# Patient Record
Sex: Male | Born: 1985 | Race: Black or African American | Hispanic: No | Smoking: Current every day smoker
Health system: Southern US, Community
[De-identification: ages and names within clinical notes are randomized; demographics above are authoritative.]

## PROBLEM LIST (undated history)

## (undated) DIAGNOSIS — I10 Essential (primary) hypertension: Secondary | ICD-10-CM

---
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
## 2005-12-12 ENCOUNTER — Emergency Department (HOSPITAL_COMMUNITY): Admission: EM | Admit: 2005-12-12 | Discharge: 2005-12-12 | Payer: Self-pay | Admitting: Emergency Medicine

## 2009-09-28 ENCOUNTER — Emergency Department (HOSPITAL_COMMUNITY): Admission: EM | Admit: 2009-09-28 | Discharge: 2009-09-28 | Payer: Self-pay | Admitting: Emergency Medicine

## 2009-10-30 ENCOUNTER — Emergency Department (HOSPITAL_COMMUNITY): Admission: EM | Admit: 2009-10-30 | Discharge: 2009-10-30 | Payer: Self-pay | Admitting: Emergency Medicine

## 2010-10-30 ENCOUNTER — Emergency Department (HOSPITAL_COMMUNITY)
Admission: EM | Admit: 2010-10-30 | Discharge: 2010-10-30 | Disposition: A | Discharge status: Home / Self Care | Payer: Self-pay | Attending: Emergency Medicine | Admitting: Emergency Medicine

## 2010-10-30 DIAGNOSIS — H938X9 Other specified disorders of ear, unspecified ear: Secondary | ICD-10-CM | POA: Insufficient documentation

## 2010-10-30 DIAGNOSIS — H60399 Other infective otitis externa, unspecified ear: Secondary | ICD-10-CM | POA: Insufficient documentation

## 2010-10-30 DIAGNOSIS — H9210 Otorrhea, unspecified ear: Secondary | ICD-10-CM | POA: Insufficient documentation

## 2010-11-01 LAB — POCT I-STAT, CHEM 8
BUN: 10 mg/dL (ref 6–23)
Calcium, Ion: 1.08 mmol/L — ABNORMAL LOW (ref 1.12–1.32)
Chloride: 109 mEq/L (ref 96–112)
HCT: 49 % (ref 39.0–52.0)
Potassium: 4 mEq/L (ref 3.5–5.1)

## 2010-11-01 LAB — BASIC METABOLIC PANEL
CO2: 21 mEq/L (ref 19–32)
GFR calc Af Amer: >60 mL/min (ref >60)
GFR calc non Af Amer: >60 mL/min (ref >60)
Glucose, Bld: 123 mg/dL — ABNORMAL HIGH (ref 70–99)
Potassium: 4.2 mEq/L (ref 3.5–5.1)
Sodium: 145 mEq/L (ref 135–145)

## 2010-11-05 LAB — GASTRIC OCCULT BLOOD (1-CARD TO LAB): Occult Blood, Gastric: POSITIVE — AB

## 2010-11-05 LAB — CBC
HCT: 45.6 % (ref 39.0–52.0)
Hemoglobin: 15.4 g/dL (ref 13.0–17.0)
MCHC: 33.7 g/dL (ref 30.0–36.0)
MCV: 95.3 fL (ref 78.0–100.0)
RBC: 4.78 MIL/uL (ref 4.22–5.81)
WBC: 8.5 10*3/uL (ref 4.0–10.5)

## 2010-11-05 LAB — DIFFERENTIAL
Basophils Relative: 0 % (ref 0–1)
Eosinophils Absolute: 0.1 10*3/uL (ref 0.0–0.7)
Eosinophils Relative: 1 % (ref 0–5)
Lymphs Abs: 2.1 10*3/uL (ref 0.7–4.0)
Monocytes Absolute: 0.4 10*3/uL (ref 0.1–1.0)
Monocytes Relative: 5 % (ref 3–12)
Neutrophils Relative %: 69 % (ref 43–77)

## 2010-11-05 LAB — BASIC METABOLIC PANEL
CO2: 27 mEq/L (ref 19–32)
Chloride: 102 mEq/L (ref 96–112)
Creatinine, Ser: 0.99 mg/dL (ref 0.4–1.5)
GFR calc Af Amer: >60 mL/min (ref >60)
Potassium: 3.8 mEq/L (ref 3.5–5.1)

## 2010-11-05 LAB — URINE MICROSCOPIC-ADD ON

## 2010-11-05 LAB — URINALYSIS, ROUTINE W REFLEX MICROSCOPIC
Bilirubin Urine: NEGATIVE
Glucose, UA: NEGATIVE mg/dL
Hgb urine dipstick: NEGATIVE
Ketones, ur: 15 mg/dL — AB
Nitrite: NEGATIVE
Specific Gravity, Urine: 1.028 (ref 1.005–1.030)
pH: 7 (ref 5.0–8.0)

## 2013-12-16 ENCOUNTER — Emergency Department (HOSPITAL_COMMUNITY)
Admission: EM | Admit: 2013-12-16 | Discharge: 2013-12-17 | Disposition: A | Discharge status: Home / Self Care | Payer: Self-pay | Attending: Emergency Medicine | Admitting: Emergency Medicine

## 2013-12-16 ENCOUNTER — Encounter (HOSPITAL_COMMUNITY): Payer: Self-pay | Admitting: Emergency Medicine

## 2013-12-16 ENCOUNTER — Emergency Department (HOSPITAL_COMMUNITY): Payer: Self-pay

## 2013-12-16 DIAGNOSIS — Y92838 Other recreation area as the place of occurrence of the external cause: Secondary | ICD-10-CM

## 2013-12-16 DIAGNOSIS — Y9239 Other specified sports and athletic area as the place of occurrence of the external cause: Secondary | ICD-10-CM | POA: Insufficient documentation

## 2013-12-16 DIAGNOSIS — Y9367 Activity, basketball: Secondary | ICD-10-CM | POA: Insufficient documentation

## 2013-12-16 DIAGNOSIS — X500XXA Overexertion from strenuous movement or load, initial encounter: Secondary | ICD-10-CM | POA: Insufficient documentation

## 2013-12-16 DIAGNOSIS — S93409A Sprain of unspecified ligament of unspecified ankle, initial encounter: Secondary | ICD-10-CM | POA: Insufficient documentation

## 2013-12-16 DIAGNOSIS — F17200 Nicotine dependence, unspecified, uncomplicated: Secondary | ICD-10-CM | POA: Insufficient documentation

## 2013-12-16 MED ORDER — IBUPROFEN 800 MG PO TABS
800.0000 mg | ORAL_TABLET | Freq: Once | ORAL | Status: AC
  Administered 2013-12-16: 800 mg via ORAL
  Filled 2013-12-16: qty 1

## 2013-12-16 NOTE — ED Notes (Signed)
Pt was playing basketball yesterday and felt a pop in his left foot, he complains of pain and swelling

## 2013-12-16 NOTE — ED Provider Notes (Signed)
CSN: [id number]     Arrival date & time 12/16/13  2206 History   First MD Initiated Contact with Patient 12/16/13 2325     This chart was scribed for non-physician practitioner, Ivonne AndrewPeter Darthy Manganelli, PA-C working with Olivia Mackielga M Otter, MD by Arlan OrganAshley Leger, ED Scribe. This patient was seen in room [facility]TR3/[facility]PT3 and the patient's care was started at 11:28 PM.   Chief Complaint  Patient presents with  . Foot Injury   The history is provided by the patient. No language interpreter was used.    HPI Comments: Gary Strickland is a 28 y.o. male who presents to the Emergency Department complaining of a L foot injury sustained yesterday. Pt states he was playing basketball when he felt a "pop" to his medial L foot after coming down from a jump. He now reports pain to the foot along with mild swelling. He states this pain is exacerbated with ambulation and weight bearing. He has not tried anything OTC for his discomfort. At this time he denies any fever, chills, numbness, or tingling. He has no other pertinent medical history. No other concerns this visit.  History reviewed. No pertinent past medical history. History reviewed. No pertinent past surgical history. History reviewed. No pertinent family history. History  Substance Use Topics  . Smoking status: Current Every Day Smoker  . Smokeless tobacco: Not on file  . Alcohol Use: No    Review of Systems  Constitutional: Negative for fever and chills.  HENT: Negative for congestion.   Eyes: Negative for redness.  Respiratory: Negative for cough.   Musculoskeletal: Positive for arthralgias (L foot).  Skin: Negative for rash.  Neurological: Negative for weakness and numbness.  Psychiatric/Behavioral: Negative for confusion.      Allergies  Review of patient's allergies indicates no known allergies.  Home Medications   Prior to Admission medications   Not on File   Triage Vitals: BP 130/102  Pulse 94  Temp(Src) 100.1 F (37.8 C) (Oral)  Resp 18   SpO2 97%   Physical Exam  Nursing note and vitals reviewed. Constitutional: He is oriented to person, place, and time. He appears well-developed and well-nourished.  HENT:  Head: Normocephalic and atraumatic.  Eyes: EOM are normal.  Neck: Normal range of motion.  Cardiovascular: Normal rate.   Pulmonary/Chest: Effort normal.  Musculoskeletal: Normal range of motion. He exhibits edema and tenderness.  Mild edema to the left medial inferior foot and ankle area. No tenderness over the lateral or medial malleolus. No fifth proximal metatarsal tenderness. No gross deformities. Normal thompsons test. No pain into the calf or lower leg. Normal dorsal pedal pulses and sensation in the foot and toes.  Neurological: He is alert and oriented to person, place, and time.  Skin: Skin is warm and dry.  Psychiatric: He has a normal mood and affect. His behavior is normal.    ED Course  Procedures   DIAGNOSTIC STUDIES: Oxygen Saturation is 97% on RA, Normal by my interpretation.    COORDINATION OF CARE: 11:00 PM- Will order DG Foot complete L. Discussed treatment plan with pt at bedside and pt agreed to plan.     X-rays reviewed. No signs of fracture or dislocation. This time suspect mild strain and inflammation. Patient instructed to use RICE therapy.  Will provide crutches for comfort. He agrees with plan.   Imaging Review Dg Foot Complete Left  12/16/2013   CLINICAL DATA:  Foot injury with pain on the plantar surface of the medial foot  EXAM: LEFT FOOT - COMPLETE 3+ VIEW  COMPARISON:  None.  FINDINGS: There is no evidence of fracture or dislocation. Incidental bipartite medial great toe sesamoid.  IMPRESSION: Negative.   Electronically Signed   By: Tiburcio PeaJonathan  Watts M.D.   On: 12/16/2013 23:49     MDM   Final diagnoses:  Ankle sprain     I personally performed the services described in this documentation, which was scribed in my presence. The recorded information has been reviewed and is  accurate.    Angus SellerPeter S Brennden Masten, PA-C 12/17/13 [phone]

## 2013-12-17 MED ORDER — IBUPROFEN 800 MG PO TABS: 800.0000 mg | ORAL_TABLET | Freq: Three times a day (TID) | ORAL | Status: AC

## 2013-12-17 NOTE — Discharge Instructions (Signed)
Your x-rays did not show any broken bones. At this time your providers feel that you have a sprain to your foot. Use rest, ice, compression and elevation to reduce pain and swelling. Followup with a primary care provider for continued evaluation and treatment.     Ankle Sprain An ankle sprain is an injury to the strong, fibrous tissues (ligaments) that hold the bones of your ankle joint together.  CAUSES An ankle sprain is usually caused by a fall or by twisting your ankle. Ankle sprains most commonly occur when you step on the outer edge of your foot, and your ankle turns inward. People who participate in sports are more prone to these types of injuries.  SYMPTOMS   Pain in your ankle. The pain may be present at rest or only when you are trying to stand or walk.  Swelling.  Bruising. Bruising may develop immediately or within 1 to 2 days after your injury.  Difficulty standing or walking, particularly when turning corners or changing directions. DIAGNOSIS  Your caregiver will ask you details about your injury and perform a physical exam of your ankle to determine if you have an ankle sprain. During the physical exam, your caregiver will press on and apply pressure to specific areas of your foot and ankle. Your caregiver will try to move your ankle in certain ways. An X-ray exam may be done to be sure a bone was not broken or a ligament did not separate from one of the bones in your ankle (avulsion fracture).  TREATMENT  Certain types of braces can help stabilize your ankle. Your caregiver can make a recommendation for this. Your caregiver may recommend the use of medicine for pain. If your sprain is severe, your caregiver may refer you to a surgeon who helps to restore function to parts of your skeletal system (orthopedist) or a physical therapist. HOME CARE INSTRUCTIONS   Apply ice to your injury for 1 2 days or as directed by your caregiver. Applying ice helps to reduce inflammation and  pain.  Put ice in a plastic bag.  Place a towel between your skin and the bag.  Leave the ice on for 15-20 minutes at a time, every 2 hours while you are awake.  Only take over-the-counter or prescription medicines for pain, discomfort, or fever as directed by your caregiver.  Elevate your injured ankle above the level of your heart as much as possible for 2 3 days.  If your caregiver recommends crutches, use them as instructed. Gradually put weight on the affected ankle. Continue to use crutches or a cane until you can walk without feeling pain in your ankle.  If you have a plaster splint, wear the splint as directed by your caregiver. Do not rest it on anything harder than a pillow for the first 24 hours. Do not put weight on it. Do not get it wet. You may take it off to take a shower or bath.  You may have been given an elastic bandage to wear around your ankle to provide support. If the elastic bandage is too tight (you have numbness or tingling in your foot or your foot becomes cold and blue), adjust the bandage to make it comfortable.  If you have an air splint, you may blow more air into it or let air out to make it more comfortable. You may take your splint off at night and before taking a shower or bath. Wiggle your toes in the splint several times per  day to decrease swelling. SEEK MEDICAL CARE IF:   You have rapidly increasing bruising or swelling.  Your toes feel extremely cold or you lose feeling in your foot.  Your pain is not relieved with medicine. SEEK IMMEDIATE MEDICAL CARE IF:  Your toes are numb or blue.  You have severe pain that is increasing. MAKE SURE YOU:   Understand these instructions.  Will watch your condition.  Will get help right away if you are not doing well or get worse. Document Released: 07/29/2005 Document Revised: 04/22/2012 Document Reviewed: 08/10/2011 [facility] Patient Information 2014 [place], [place].

## 2013-12-17 NOTE — ED Provider Notes (Signed)
Medical screening examination/treatment/procedure(s) were performed by non-physician practitioner and as supervising physician I was immediately available for consultation/collaboration.   EKG Interpretation None       Olivia Mackielga M Tovah Slavick, MD 12/17/13 [phone]

## 2015-03-19 IMAGING — CR DG FOOT COMPLETE 3+V*L*
3 series · 3 of 3 positions shown · non-contrast
Comparison: None.

CLINICAL DATA: Foot injury with pain on the plantar surface of the
medial foot

EXAM:
LEFT FOOT - COMPLETE 3+ VIEW

[x foot ap left]
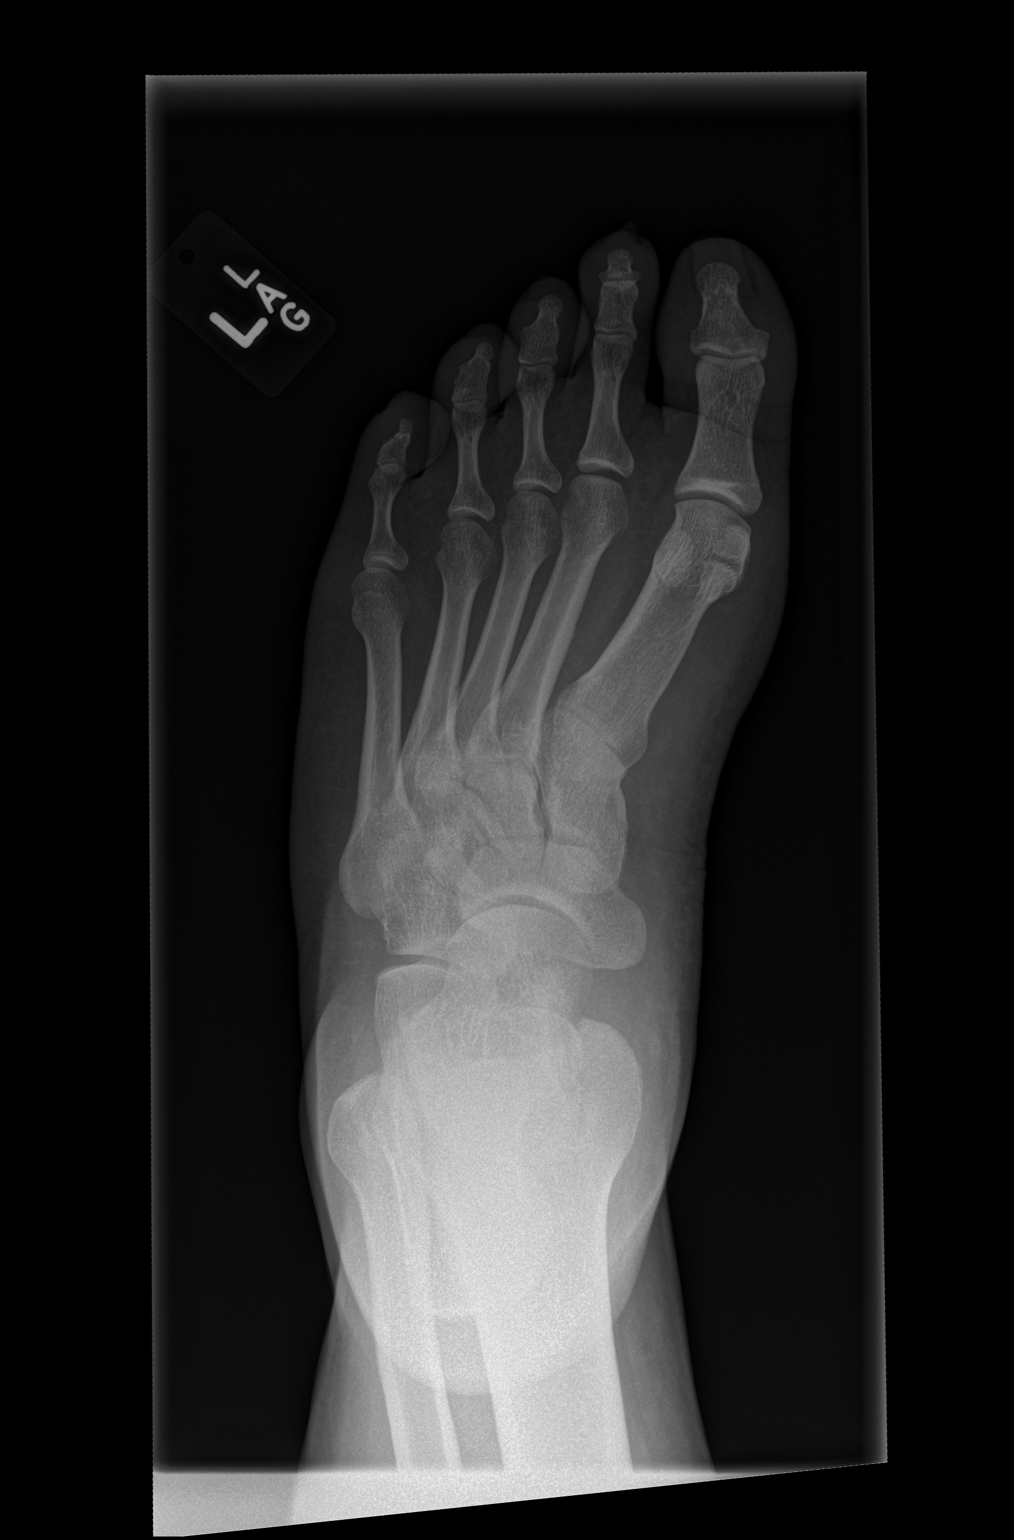

[x foot obl left]
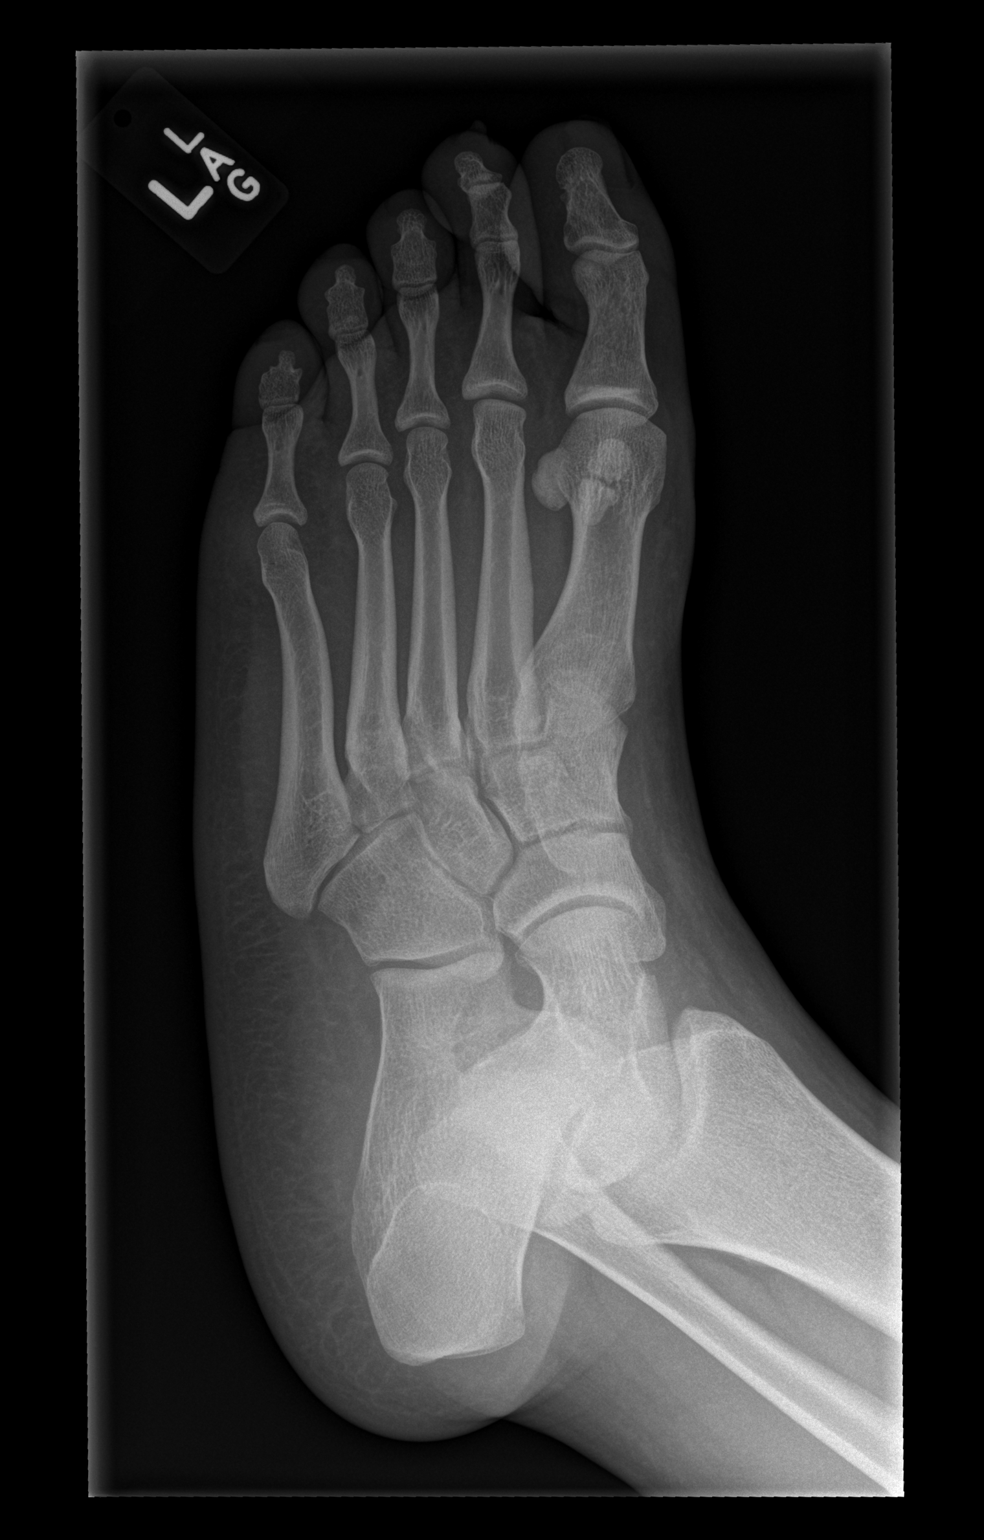

[x foot lat left]
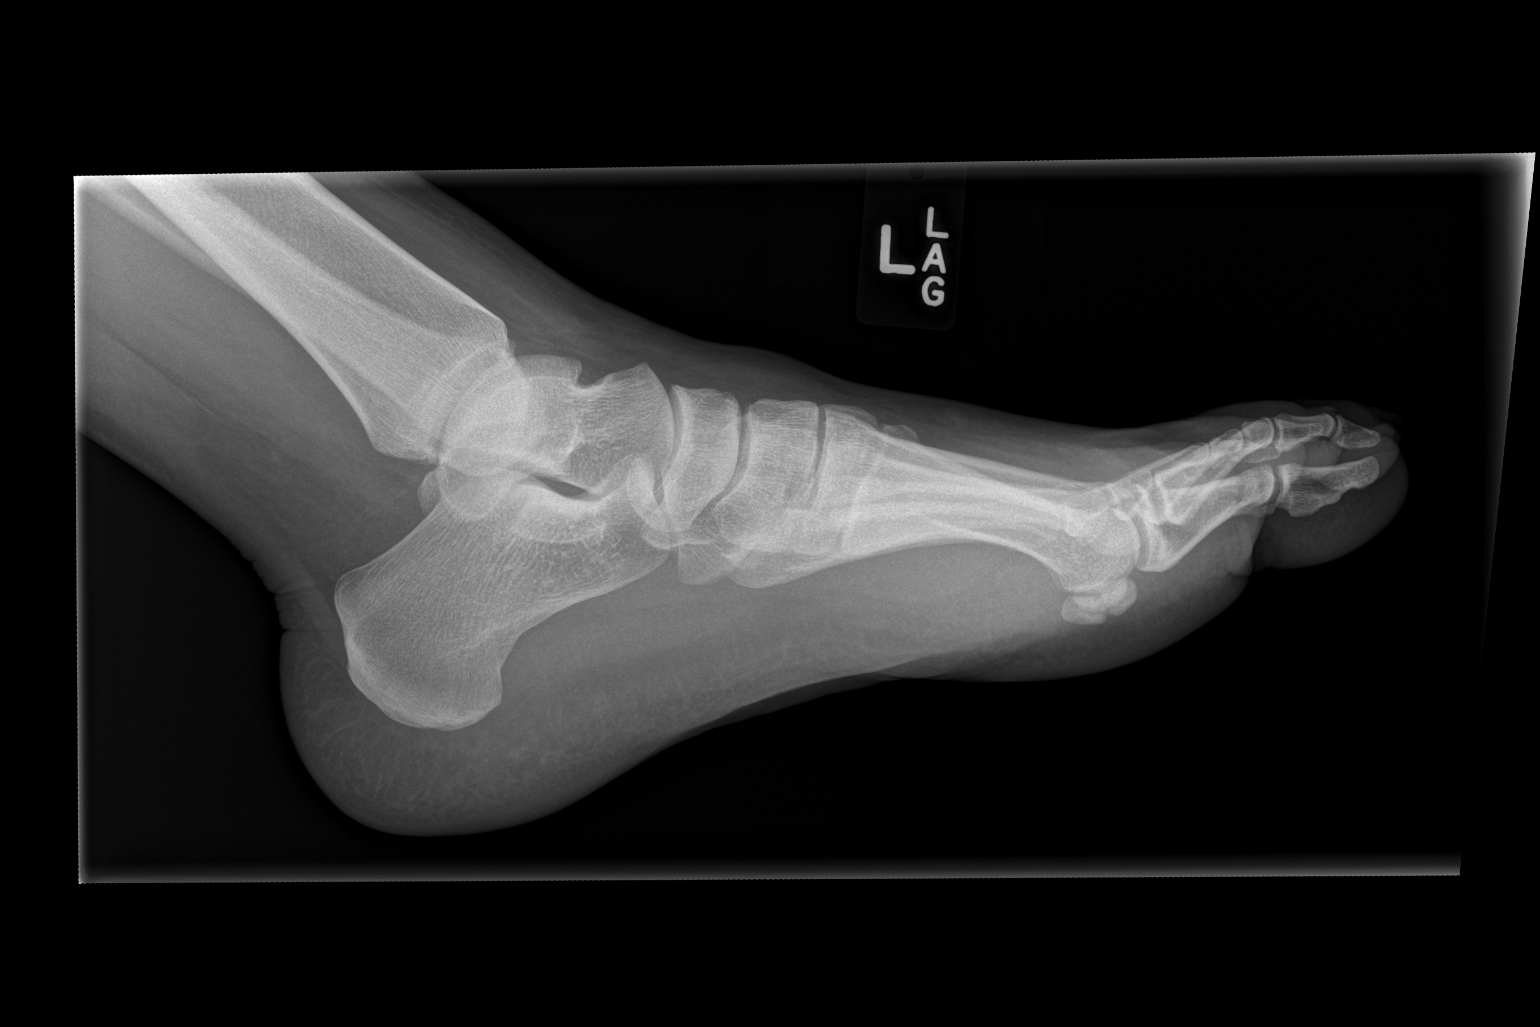

[3 of 3 positions shown; findings below may reference images not displayed]

FINDINGS: There is no evidence of fracture or dislocation. Incidental
bipartite medial great toe sesamoid.
IMPRESSION: Negative.

## 2020-10-27 ENCOUNTER — Emergency Department (HOSPITAL_COMMUNITY)
Admission: EM | Admit: 2020-10-27 | Discharge: 2020-10-27 | Disposition: A | Discharge status: Home / Self Care | Payer: Self-pay | Attending: Emergency Medicine | Admitting: Emergency Medicine

## 2020-10-27 ENCOUNTER — Emergency Department (HOSPITAL_COMMUNITY): Payer: Self-pay

## 2020-10-27 DIAGNOSIS — Z79899 Other long term (current) drug therapy: Secondary | ICD-10-CM | POA: Insufficient documentation

## 2020-10-27 DIAGNOSIS — I10 Essential (primary) hypertension: Secondary | ICD-10-CM | POA: Insufficient documentation

## 2020-10-27 DIAGNOSIS — F17200 Nicotine dependence, unspecified, uncomplicated: Secondary | ICD-10-CM | POA: Insufficient documentation

## 2020-10-27 DIAGNOSIS — R1110 Vomiting, unspecified: Secondary | ICD-10-CM | POA: Insufficient documentation

## 2020-10-27 DIAGNOSIS — K219 Gastro-esophageal reflux disease without esophagitis: Secondary | ICD-10-CM | POA: Insufficient documentation

## 2020-10-27 LAB — CBC
HCT: 45.5 % (ref 39.0–52.0)
Hemoglobin: 15 g/dL (ref 13.0–17.0)
MCH: 32 pg (ref 26.0–34.0)
MCHC: 33 g/dL (ref 30.0–36.0)
MCV: 97 fL (ref 80.0–100.0)
Platelets: 243 10*3/uL (ref 150–400)
RBC: 4.69 MIL/uL (ref 4.22–5.81)
RDW: 13.1 % (ref 11.5–15.5)
WBC: 8.4 10*3/uL (ref 4.0–10.5)
nRBC: 0 % (ref 0.0–0.2)

## 2020-10-27 LAB — HEPATIC FUNCTION PANEL
ALT: 33 U/L (ref 0–44)
AST: 28 U/L (ref 15–41)
Albumin: 4 g/dL (ref 3.5–5.0)
Alkaline Phosphatase: 57 U/L (ref 38–126)
Bilirubin, Direct: 0.2 mg/dL (ref 0.0–0.2)
Indirect Bilirubin: 0.7 mg/dL (ref 0.3–0.9)
Total Bilirubin: 0.9 mg/dL (ref 0.3–1.2)
Total Protein: 7 g/dL (ref 6.5–8.1)

## 2020-10-27 LAB — LIPASE, BLOOD: Lipase: 39 U/L (ref 11–51)

## 2020-10-27 LAB — BASIC METABOLIC PANEL
Anion gap: 8 (ref 5–15)
BUN: 11 mg/dL (ref 6–20)
CO2: 23 mmol/L (ref 22–32)
Calcium: 9.2 mg/dL (ref 8.9–10.3)
Chloride: 107 mmol/L (ref 98–111)
Creatinine, Ser: 0.99 mg/dL (ref 0.61–1.24)
GFR, Estimated: >60 mL/min (ref >60)
Glucose, Bld: 107 mg/dL — ABNORMAL HIGH (ref 70–99)
Potassium: 4.3 mmol/L (ref 3.5–5.1)
Sodium: 138 mmol/L (ref 135–145)

## 2020-10-27 LAB — POC OCCULT BLOOD, ED: Fecal Occult Bld: NEGATIVE

## 2020-10-27 LAB — TROPONIN I (HIGH SENSITIVITY)
Troponin I (High Sensitivity): 10 ng/L (ref <18)
Troponin I (High Sensitivity): 6 ng/L (ref <18)

## 2020-10-27 MED ORDER — AMLODIPINE BESYLATE 5 MG PO TABS
5.0000 mg | ORAL_TABLET | Freq: Once | ORAL | Status: AC
  Administered 2020-10-27: 5 mg via ORAL
  Filled 2020-10-27: qty 1

## 2020-10-27 MED ORDER — PANTOPRAZOLE SODIUM 40 MG IV SOLR
40.0000 mg | Freq: Once | INTRAVENOUS | Status: AC
  Administered 2020-10-27: 40 mg via INTRAVENOUS
  Filled 2020-10-27: qty 40

## 2020-10-27 MED ORDER — AMLODIPINE BESYLATE 5 MG PO TABS: 5.0000 mg | ORAL_TABLET | Freq: Every day | ORAL | 0 refills | Status: AC

## 2020-10-27 MED ORDER — MORPHINE SULFATE (PF) 4 MG/ML IV SOLN
4.0000 mg | Freq: Once | INTRAVENOUS | Status: AC
  Administered 2020-10-27: 4 mg via INTRAVENOUS
  Filled 2020-10-27: qty 1

## 2020-10-27 MED ORDER — PANTOPRAZOLE SODIUM 20 MG PO TBEC: 20.0000 mg | DELAYED_RELEASE_TABLET | Freq: Every day | ORAL | 0 refills | Status: AC

## 2020-10-27 MED ORDER — SUCRALFATE 1 G PO TABS: 1.0000 g | ORAL_TABLET | Freq: Three times a day (TID) | ORAL | 0 refills | Status: AC

## 2020-10-27 MED ORDER — ONDANSETRON HCL 4 MG/2ML IJ SOLN
4.0000 mg | Freq: Once | INTRAMUSCULAR | Status: AC
  Administered 2020-10-27: 4 mg via INTRAVENOUS
  Filled 2020-10-27: qty 2

## 2020-10-27 NOTE — ED Triage Notes (Signed)
Pt here from work with c/o cp along with some n/v and some slight sob

## 2020-10-27 NOTE — Discharge Instructions (Signed)
Start taking the medication for your acid reflux.  Start taking the blood pressure medication.  Follow up with your primary care doctor and the GI doctor.  Return to the ED for worsening symptoms

## 2020-10-27 NOTE — ED Provider Notes (Signed)
[facility] EMERGENCY DEPARTMENT Provider Note   CSN: [id number] Arrival date & time: 10/27/20  0818     History CC: Vomiting, chest pain  Gary Strickland is a 35 y.o. male.  HPI   Patient presented to the ED for evaluation of chest pain as well as nausea vomiting.  Patient states symptoms started last evening.  He is having a sharp pain in the center of chest.  Patient also had episodes of nausea vomiting and has vomited up blood.  Patient has some upper abdominal discomfort.  He denies any heavy use of NSAIDs.  He denies of heavy alcohol use.  He has not noticed any blood in the stool.  He denies any fevers chills.  No past medical history on file.  There are no problems to display for this patient.   No past surgical history on file.     No family history on file.  Social History   Tobacco Use  . Smoking status: Current Every Day Smoker  Substance Use Topics  . Alcohol use: No    Home Medications Prior to Admission medications   Medication Sig Start Date End Date Taking? Authorizing Provider  amLODipine (NORVASC) 5 MG tablet Take 1 tablet (5 mg total) by mouth daily. 10/27/20  Yes Linwood Dibbles, MD  pantoprazole (PROTONIX) 20 MG tablet Take 1 tablet (20 mg total) by mouth daily. 10/27/20  Yes Linwood Dibbles, MD  sucralfate (CARAFATE) 1 g tablet Take 1 tablet (1 g total) by mouth 4 (four) times daily -  with meals and at bedtime. 10/27/20  Yes Linwood Dibbles, MD  ibuprofen (ADVIL,MOTRIN) 800 MG tablet Take 1 tablet (800 mg total) by mouth 3 (three) times daily. Patient not taking: Reported on 10/27/2020 12/17/13   Ivonne Andrew, PA-C    Allergies    Patient has no known allergies.  Review of Systems   Review of Systems  All other systems reviewed and are negative.   Physical Exam Updated Vital Signs BP (!) 189/108   Pulse 70   Temp 98 F (36.7 C)   Resp (!) 22   SpO2 98%   Physical Exam Vitals and nursing note reviewed.  Constitutional:       Appearance: He is well-developed. He is ill-appearing.  HENT:     Head: Normocephalic and atraumatic.     Right Ear: External ear normal.     Left Ear: External ear normal.  Eyes:     General: No scleral icterus.       Right eye: No discharge.        Left eye: No discharge.     Conjunctiva/sclera: Conjunctivae normal.  Neck:     Trachea: No tracheal deviation.  Cardiovascular:     Rate and Rhythm: Normal rate and regular rhythm.  Pulmonary:     Effort: Pulmonary effort is normal. No respiratory distress.     Breath sounds: Normal breath sounds. No stridor. No wheezing or rales.  Abdominal:     General: Bowel sounds are normal. There is no distension.     Palpations: Abdomen is soft.     Tenderness: There is abdominal tenderness. There is no guarding or rebound.     Comments: Mild tenderness palpation epigastric region  Musculoskeletal:        General: No tenderness.     Cervical back: Neck supple.  Skin:    General: Skin is warm and dry.     Findings: No rash.  Neurological:  Mental Status: He is alert.     Cranial Nerves: No cranial nerve deficit (no facial droop, extraocular movements intact, no slurred speech).     Sensory: No sensory deficit.     Motor: No abnormal muscle tone or seizure activity.     Coordination: Coordination normal.     ED Results / Procedures / Treatments   Labs (all labs ordered are listed, but only abnormal results are displayed) Labs Reviewed  BASIC METABOLIC PANEL - Abnormal; Notable for the following components:      Result Value   Glucose, Bld 107 (*)    All other components within normal limits  CBC  HEPATIC FUNCTION PANEL  LIPASE, BLOOD  POC OCCULT BLOOD, ED  TROPONIN I (HIGH SENSITIVITY)  TROPONIN I (HIGH SENSITIVITY)    EKG EKG Interpretation  Date/Time:  Friday October 27 2020 08:19:54 EDT Ventricular Rate:  75 PR Interval:  138 QRS Duration: 88 QT Interval:  368 QTC Calculation: 410 R Axis:   11 Text  Interpretation: Normal sinus rhythm Normal ECG No old tracing to compare Confirmed by Linwood Dibbles [phone]) on 10/27/2020 10:51:26 AM   Radiology DG Chest 2 View  Result Date: 10/27/2020 CLINICAL DATA:  Chest pain. EXAM: CHEST - 2 VIEW COMPARISON:  None. FINDINGS: The heart size and mediastinal contours are within normal limits. Both lungs are clear. No pneumothorax or pleural effusion is noted. The visualized skeletal structures are unremarkable. IMPRESSION: No active cardiopulmonary disease. Electronically Signed   By: Lupita Raider M.D.   On: 10/27/2020 08:55    Procedures Procedures   Medications Ordered in ED Medications  amLODipine (NORVASC) tablet 5 mg (has no administration in time range)  morphine 4 MG/ML injection 4 mg (4 mg Intravenous Given 10/27/20 1159)  ondansetron (ZOFRAN) injection 4 mg (4 mg Intravenous Given 10/27/20 1158)  pantoprazole (PROTONIX) injection 40 mg (40 mg Intravenous Given 10/27/20 1158)    ED Course  I have reviewed the triage vital signs and the nursing notes.  Pertinent labs & imaging results that were available during my care of the patient were reviewed by me and considered in my medical decision making (see chart for details).    MDM Rules/Calculators/A&P                          She presented to the ED for evaluation of chest pain and vomiting.  Patient complained of multiple episodes of emesis noticing some blood in his emesis.  Patient was evaluated for the possibility of acute coronary syndrome.  No concerning findings on EKG and his serial troponins are negative.  Patient has normal LFTs and lipase.  No findings of hepatitis or pancreatitis.  Doubt biliary colic.  Patient was treated with antacids and medications for pain.  He is feeling better.  He does state he has a history of bad acid reflux and has tried Tums over-the-counter without relief.  I suspect his symptoms today are related to esophagitis.  He has improved with treatment in the ER.   He is feeling much better and is now comfortable.  She is noted to be hypertensive.  He has not seen a primary care doctor for a checkup.  Last vital signs I see in his record were back in 2015 and at that time he had mildly elevated blood pressures.  Patient has been given a dose of antihypertensive agents.  I doubt that he is having any signs of acute hypertensive emergency  or dissection.  Will discharge home on a course of antihypertensive agents.  Stressed the importance of outpatient follow-up with a primary care doctor and GI. Final Clinical Impression(s) / ED Diagnoses Final diagnoses:  Hypertension, unspecified type  Gastroesophageal reflux disease, unspecified whether esophagitis present    Rx / DC Orders ED Discharge Orders         Ordered    pantoprazole (PROTONIX) 20 MG tablet  Daily        10/27/20 1333    amLODipine (NORVASC) 5 MG tablet  Daily        10/27/20 1333    sucralfate (CARAFATE) 1 g tablet  3 times daily with meals & bedtime        10/27/20 1333           Linwood Dibbles, MD 10/27/20 1337

## 2022-01-18 ENCOUNTER — Emergency Department (HOSPITAL_BASED_OUTPATIENT_CLINIC_OR_DEPARTMENT_OTHER)
Admission: EM | Admit: 2022-01-18 | Discharge: 2022-01-18 | Disposition: A | Discharge status: Home / Self Care | Payer: Self-pay | Attending: Emergency Medicine | Admitting: Emergency Medicine

## 2022-01-18 ENCOUNTER — Encounter (HOSPITAL_BASED_OUTPATIENT_CLINIC_OR_DEPARTMENT_OTHER): Payer: Self-pay | Admitting: Emergency Medicine

## 2022-01-18 ENCOUNTER — Other Ambulatory Visit: Payer: Self-pay

## 2022-01-18 DIAGNOSIS — K529 Noninfective gastroenteritis and colitis, unspecified: Secondary | ICD-10-CM | POA: Insufficient documentation

## 2022-01-18 DIAGNOSIS — I10 Essential (primary) hypertension: Secondary | ICD-10-CM | POA: Insufficient documentation

## 2022-01-18 DIAGNOSIS — Z79899 Other long term (current) drug therapy: Secondary | ICD-10-CM | POA: Insufficient documentation

## 2022-01-18 LAB — URINALYSIS, ROUTINE W REFLEX MICROSCOPIC
Bilirubin Urine: NEGATIVE
Glucose, UA: NEGATIVE mg/dL
Hgb urine dipstick: NEGATIVE
Ketones, ur: NEGATIVE mg/dL
Leukocytes,Ua: NEGATIVE
Nitrite: NEGATIVE
Specific Gravity, Urine: 1.022 (ref 1.005–1.030)
pH: 8 (ref 5.0–8.0)

## 2022-01-18 LAB — CBC
HCT: 43.1 % (ref 39.0–52.0)
Hemoglobin: 14.5 g/dL (ref 13.0–17.0)
MCH: 31.4 pg (ref 26.0–34.0)
MCHC: 33.6 g/dL (ref 30.0–36.0)
MCV: 93.3 fL (ref 80.0–100.0)
Platelets: 257 10*3/uL (ref 150–400)
RBC: 4.62 MIL/uL (ref 4.22–5.81)
RDW: 13 % (ref 11.5–15.5)
WBC: 9.3 10*3/uL (ref 4.0–10.5)
nRBC: 0 % (ref 0.0–0.2)

## 2022-01-18 LAB — COMPREHENSIVE METABOLIC PANEL
ALT: 34 U/L (ref 0–44)
AST: 24 U/L (ref 15–41)
Albumin: 4.3 g/dL (ref 3.5–5.0)
Alkaline Phosphatase: 69 U/L (ref 38–126)
Anion gap: 8 (ref 5–15)
BUN: 9 mg/dL (ref 6–20)
CO2: 27 mmol/L (ref 22–32)
Calcium: 9.9 mg/dL (ref 8.9–10.3)
Chloride: 106 mmol/L (ref 98–111)
Creatinine, Ser: 1.02 mg/dL (ref 0.61–1.24)
GFR, Estimated: >60 mL/min (ref >60)
Glucose, Bld: 105 mg/dL — ABNORMAL HIGH (ref 70–99)
Potassium: 4.2 mmol/L (ref 3.5–5.1)
Sodium: 141 mmol/L (ref 135–145)
Total Bilirubin: 0.5 mg/dL (ref 0.3–1.2)
Total Protein: 7.4 g/dL (ref 6.5–8.1)

## 2022-01-18 LAB — LIPASE, BLOOD: Lipase: 17 U/L (ref 11–51)

## 2022-01-18 MED ORDER — LACTATED RINGERS IV BOLUS
1000.0000 mL | Freq: Once | INTRAVENOUS | Status: AC
Start: 2022-01-18 — End: 2022-01-18
  Administered 2022-01-18: 1000 mL via INTRAVENOUS

## 2022-01-18 MED ORDER — ONDANSETRON HCL 4 MG PO TABS: 4.0000 mg | ORAL_TABLET | Freq: Four times a day (QID) | ORAL | 0 refills | Status: DC

## 2022-01-18 MED ORDER — ONDANSETRON HCL 4 MG/2ML IJ SOLN
4.0000 mg | Freq: Once | INTRAMUSCULAR | Status: AC
  Administered 2022-01-18: 4 mg via INTRAVENOUS
  Filled 2022-01-18: qty 2

## 2022-01-18 MED ORDER — ONDANSETRON 4 MG PO TBDP
4.0000 mg | ORAL_TABLET | Freq: Once | ORAL | Status: AC
  Administered 2022-01-18: 4 mg via ORAL
  Filled 2022-01-18: qty 1

## 2022-01-18 NOTE — ED Provider Notes (Signed)
[facility] EMERGENCY DEPT Provider Note   CSN: [id number] Arrival date & time: 01/18/22  1224     History  Chief Complaint  Patient presents with   Emesis   Hypertension    Gary Strickland is a 36 y.o. male who presents to the ED for evaluation of 5-day history of nausea and vomiting.  Patient states that he feels very "dry" and is unable to tolerate liquid or foods.  Approximately 2-5 episodes of emesis per day.  No treatment prior to arrival.  He denies abdominal pain, diarrhea, sore throat, congestion, chest pain, shortness of breath.  He denies headache, dizziness, vision changes or tinnitus.   Emesis Hypertension       Home Medications Prior to Admission medications   Medication Sig Start Date End Date Taking? Authorizing Provider  ondansetron (ZOFRAN) 4 MG tablet Take 1 tablet (4 mg total) by mouth every 6 (six) hours. 01/18/22  Yes Kathe Becton R, PA-C  amLODipine (NORVASC) 5 MG tablet Take 1 tablet (5 mg total) by mouth daily. 10/27/20   Dorie Rank, MD  ibuprofen (ADVIL,MOTRIN) 800 MG tablet Take 1 tablet (800 mg total) by mouth 3 (three) times daily. Patient not taking: Reported on 10/27/2020 12/17/13   Hazel Sams, PA-C  pantoprazole (PROTONIX) 20 MG tablet Take 1 tablet (20 mg total) by mouth daily. 10/27/20   Dorie Rank, MD  sucralfate (CARAFATE) 1 g tablet Take 1 tablet (1 g total) by mouth 4 (four) times daily -  with meals and at bedtime. 10/27/20   Dorie Rank, MD      Allergies    Patient has no known allergies.    Review of Systems   Review of Systems  Gastrointestinal:  Positive for vomiting.    Physical Exam Updated Vital Signs BP (!) 186/110   Pulse 87   Temp 98.6 F (37 C) (Oral)   Resp 18   Ht 6' (1.829 m)   Wt 127 kg   SpO2 100%   BMI 37.97 kg/m  Physical Exam Vitals and nursing note reviewed.  Constitutional:      General: He is not in acute distress.    Appearance: He is ill-appearing.  HENT:     Head: Atraumatic.      Mouth/Throat:     Mouth: Mucous membranes are dry.     Comments: Oropharynx is clear without erythema Eyes:     Conjunctiva/sclera: Conjunctivae normal.  Cardiovascular:     Rate and Rhythm: Normal rate and regular rhythm.     Pulses: Normal pulses.     Heart sounds: No murmur heard. Pulmonary:     Effort: Pulmonary effort is normal. No respiratory distress.     Breath sounds: Normal breath sounds.  Abdominal:     General: Abdomen is flat. There is no distension.     Palpations: Abdomen is soft.     Tenderness: There is no abdominal tenderness.  Musculoskeletal:        General: Normal range of motion.     Cervical back: Normal range of motion.  Skin:    General: Skin is warm and dry.     Capillary Refill: Capillary refill takes less than 2 seconds.  Neurological:     General: No focal deficit present.     Mental Status: He is alert.  Psychiatric:        Mood and Affect: Mood normal.     ED Results / Procedures / Treatments   Labs (all labs ordered are listed,  but only abnormal results are displayed) Labs Reviewed  COMPREHENSIVE METABOLIC PANEL - Abnormal; Notable for the following components:      Result Value   Glucose, Bld 105 (*)    All other components within normal limits  LIPASE, BLOOD  CBC  URINALYSIS, ROUTINE W REFLEX MICROSCOPIC    EKG None  Radiology No results found.  Procedures Procedures    Medications Ordered in ED Medications  lactated ringers bolus 1,000 mL (1,000 mLs Intravenous New Bag/Given 01/18/22 1455)  ondansetron (ZOFRAN) injection 4 mg (4 mg Intravenous Given 01/18/22 1454)    ED Course/ Medical Decision Making/ A&P                           Medical Decision Making Amount and/or Complexity of Data Reviewed Labs: ordered.  Risk Prescription drug management.   36 year old male presents to the ED for evaluation of nausea and vomiting for 5 days.  Differentials include viral gastroenteritis, food poisoning, sepsis. Patient is  ill-appearing although nontoxic.  Vital significant for hypertension as high as 183/130.  On reevaluation this was 156/111.  However, patient denies tinnitus, headache, vision changes, chest pain.  On exam, patient has dry mucous membranes.  Lungs CTA bilaterally.  Abdomen is flat, nondistended and nontender to palpation.  I ordered and interpreted labs which show no leukocytosis, normal lipase, CMP normal, UA normal.  Patient was given 1 L fluids and Zofran with significant improvement.  He reports feeling much better.  Symptoms more consistent with viral gastroenteritis.  Will discharge home with prescription of Zofran and instructed to follow-up with PCP regarding elevated blood pressure.  Patient expresses understanding is amenable to plan.  Discharged home in good condition Final Clinical Impression(s) / ED Diagnoses Final diagnoses:  Gastroenteritis    Rx / DC Orders ED Discharge Orders          Ordered    ondansetron (ZOFRAN) 4 MG tablet  Every 6 hours        01/18/22 1556              Tonye Pearson, [place] 01/18/22 1603    Wyvonnia Dusky, MD 01/18/22 1709

## 2022-01-18 NOTE — ED Notes (Signed)
Patient verbalizes understanding of discharge instructions. Opportunity for questioning and answers were provided. Patient discharged from ED.  °

## 2022-01-18 NOTE — ED Triage Notes (Signed)
Vomiting since Monday.

## 2022-01-18 NOTE — Discharge Instructions (Addendum)
Your labs today were overall reassuring.  Your symptoms are likely due to viral gastroenteritis, otherwise no systemic bug.  I sent you home with prescription of Zofran which you can use as needed to help with your nausea.  Continue to drink plenty of water and eat what you can.  Your symptoms should resolve in a few more days.  Please follow-up with your PCP regarding your elevated blood pressure.

## 2022-01-18 NOTE — ED Notes (Signed)
Pt currently unable to provide urine sample. Provided pt urinal and will void when he feels he is able to.

## 2022-01-28 IMAGING — DX DG CHEST 2V
2 series · 2 of 2 positions shown · non-contrast
Comparison: None.

CLINICAL DATA: Chest pain.

EXAM:
CHEST - 2 VIEW

[chest pa]
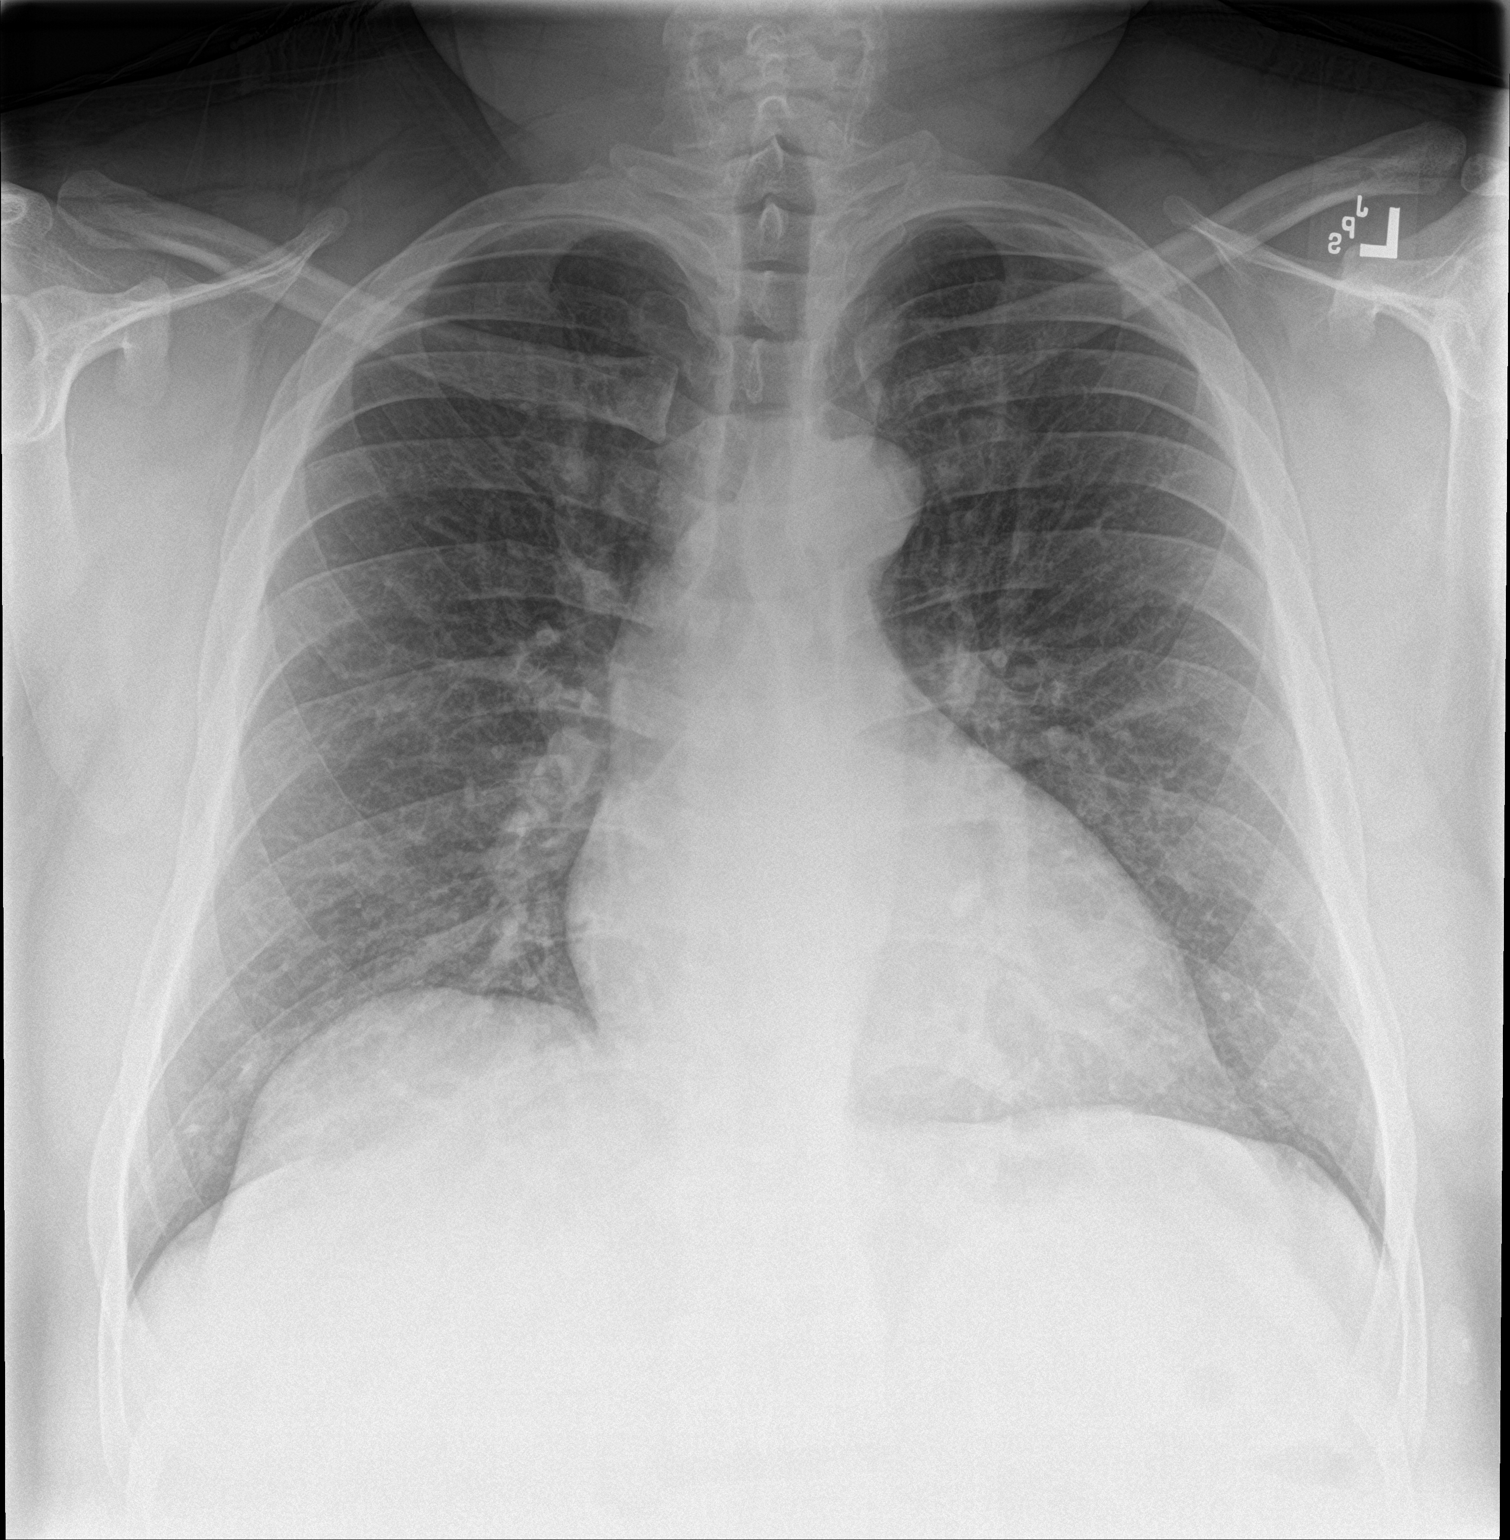

[chest lat]
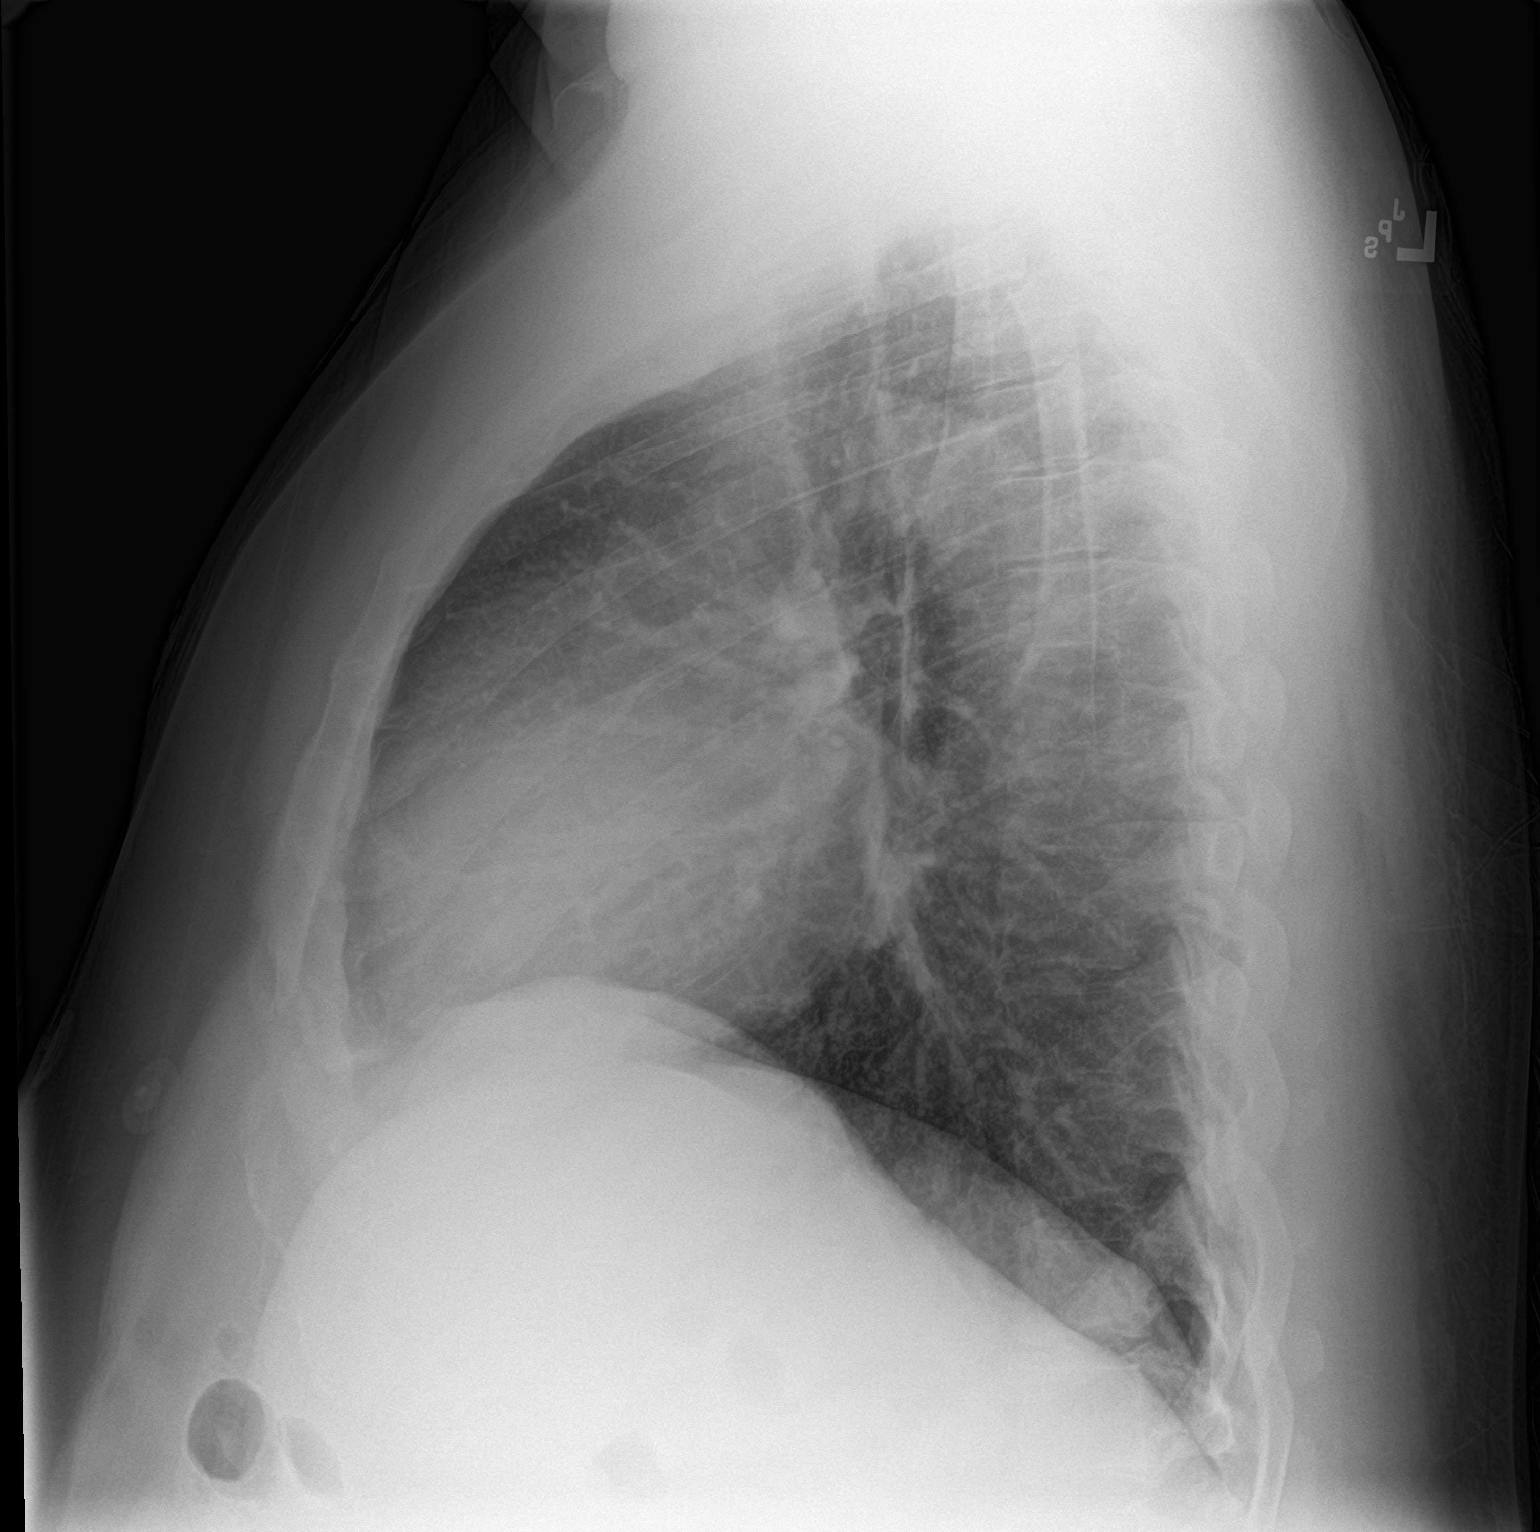

[2 of 2 positions shown; findings below may reference images not displayed]

FINDINGS: The heart size and mediastinal contours are within normal limits.
Both lungs are clear. No pneumothorax or pleural effusion is noted.
The visualized skeletal structures are unremarkable.
IMPRESSION: No active cardiopulmonary disease.

## 2022-08-23 ENCOUNTER — Emergency Department (HOSPITAL_BASED_OUTPATIENT_CLINIC_OR_DEPARTMENT_OTHER)
Admission: EM | Admit: 2022-08-23 | Discharge: 2022-08-23 | Disposition: A | Discharge status: Home / Self Care | Payer: Self-pay | Attending: Emergency Medicine | Admitting: Emergency Medicine

## 2022-08-23 ENCOUNTER — Other Ambulatory Visit: Payer: Self-pay

## 2022-08-23 ENCOUNTER — Emergency Department (HOSPITAL_BASED_OUTPATIENT_CLINIC_OR_DEPARTMENT_OTHER): Payer: Self-pay

## 2022-08-23 DIAGNOSIS — I10 Essential (primary) hypertension: Secondary | ICD-10-CM | POA: Insufficient documentation

## 2022-08-23 DIAGNOSIS — Z79899 Other long term (current) drug therapy: Secondary | ICD-10-CM | POA: Insufficient documentation

## 2022-08-23 DIAGNOSIS — B349 Viral infection, unspecified: Secondary | ICD-10-CM | POA: Insufficient documentation

## 2022-08-23 DIAGNOSIS — Z1152 Encounter for screening for COVID-19: Secondary | ICD-10-CM | POA: Insufficient documentation

## 2022-08-23 LAB — CBC WITH DIFFERENTIAL/PLATELET
Abs Immature Granulocytes: 0.02 10*3/uL (ref 0.00–0.07)
Basophils Absolute: 0 10*3/uL (ref 0.0–0.1)
Basophils Relative: 0 %
Eosinophils Absolute: 0.1 10*3/uL (ref 0.0–0.5)
Eosinophils Relative: 1 %
HCT: 42.2 % (ref 39.0–52.0)
Hemoglobin: 14.6 g/dL (ref 13.0–17.0)
Immature Granulocytes: 0 %
Lymphocytes Relative: 3 %
Lymphs Abs: 0.2 10*3/uL — ABNORMAL LOW (ref 0.7–4.0)
MCH: 32.3 pg (ref 26.0–34.0)
MCHC: 34.6 g/dL (ref 30.0–36.0)
MCV: 93.4 fL (ref 80.0–100.0)
Monocytes Absolute: 0.9 10*3/uL (ref 0.1–1.0)
Monocytes Relative: 13 %
Neutro Abs: 6.1 10*3/uL (ref 1.7–7.7)
Neutrophils Relative %: 83 %
Platelets: 175 10*3/uL (ref 150–400)
RBC: 4.52 MIL/uL (ref 4.22–5.81)
RDW: 12.9 % (ref 11.5–15.5)
WBC: 7.4 10*3/uL (ref 4.0–10.5)
nRBC: 0 % (ref 0.0–0.2)

## 2022-08-23 LAB — COMPREHENSIVE METABOLIC PANEL
ALT: 21 U/L (ref 0–44)
AST: 20 U/L (ref 15–41)
Albumin: 4.3 g/dL (ref 3.5–5.0)
Alkaline Phosphatase: 61 U/L (ref 38–126)
Anion gap: 8 (ref 5–15)
BUN: 8 mg/dL (ref 6–20)
CO2: 24 mmol/L (ref 22–32)
Calcium: 9.2 mg/dL (ref 8.9–10.3)
Chloride: 106 mmol/L (ref 98–111)
Creatinine, Ser: 0.98 mg/dL (ref 0.61–1.24)
GFR, Estimated: >60 mL/min (ref >60)
Glucose, Bld: 101 mg/dL — ABNORMAL HIGH (ref 70–99)
Potassium: 4.1 mmol/L (ref 3.5–5.1)
Sodium: 138 mmol/L (ref 135–145)
Total Bilirubin: 0.4 mg/dL (ref 0.3–1.2)
Total Protein: 6.8 g/dL (ref 6.5–8.1)

## 2022-08-23 LAB — RESP PANEL BY RT-PCR (RSV, FLU A&B, COVID)  RVPGX2
Influenza A by PCR: NEGATIVE
Influenza B by PCR: NEGATIVE
Resp Syncytial Virus by PCR: NEGATIVE
SARS Coronavirus 2 by RT PCR: NEGATIVE

## 2022-08-23 LAB — LIPASE, BLOOD: Lipase: 14 U/L (ref 11–51)

## 2022-08-23 MED ORDER — SODIUM CHLORIDE 0.9 % IV BOLUS
1000.0000 mL | Freq: Once | INTRAVENOUS | Status: AC
Start: 2022-08-23 — End: 2022-08-23
  Administered 2022-08-23: 1000 mL via INTRAVENOUS

## 2022-08-23 MED ORDER — ONDANSETRON HCL 4 MG/2ML IJ SOLN
4.0000 mg | Freq: Once | INTRAMUSCULAR | Status: AC
  Administered 2022-08-23: 4 mg via INTRAVENOUS
  Filled 2022-08-23: qty 2

## 2022-08-23 MED ORDER — ACETAMINOPHEN 325 MG PO TABS
ORAL_TABLET | ORAL | Status: AC
Start: 2022-08-23 — End: 2022-08-23
  Filled 2022-08-23: qty 1

## 2022-08-23 MED ORDER — ONDANSETRON HCL 4 MG PO TABS: 4.0000 mg | ORAL_TABLET | Freq: Four times a day (QID) | ORAL | 0 refills | Status: AC

## 2022-08-23 MED ORDER — ACETAMINOPHEN 325 MG PO TABS
650.0000 mg | ORAL_TABLET | Freq: Once | ORAL | Status: AC
  Administered 2022-08-23: 650 mg via ORAL
  Filled 2022-08-23: qty 2

## 2022-08-23 NOTE — Discharge Instructions (Addendum)
You were seen in the emergency department for cough body aches and elevated blood pressure.  Your COVID and flu test were negative.  Your chest x-ray did not show any pneumonia.  Please continue your blood pressure medication daily as prescribed.  We are prescribing you some nausea medication to use as needed.  Drink plenty of fluids and rest.  Follow-up with your regular doctor.  Return to the emergency department if any worsening or concerning symptoms

## 2022-08-23 NOTE — ED Notes (Signed)
Completed PO challenge with no nausea/vomiting

## 2022-08-23 NOTE — ED Triage Notes (Addendum)
Patient arrives ambulatory to room with complaints of being hypertensive this morning (systolic in the 625'W), nausea, and overall "feeling bad" and in pain 9/10.  Patient did take his blood pressure medication this morning.

## 2022-08-23 NOTE — ED Notes (Signed)
Patient given discharge instructions. Questions were answered. Patient verbalized understanding of discharge instructions and care at home.  

## 2022-08-23 NOTE — ED Provider Notes (Signed)
[facility]ERGENC[facility] DEPT Provider Note   CSN: [id number] Arrival date & time: 08/23/22  3748     History  Chief Complaint  Patient presents with   Hypertension   Fatigue    Gary Strickland is a 37 y.o. male.  He has a history of hypertension and he inconsistently takes his medication.  He said he started feeling bad last night with bodyaches malaise nausea vomiting.  Headache blurry vision.  Checked his blood pressure this morning and found it to be elevated.  Does not know what his normal blood pressure runs does not check it routinely.  He took his blood pressure medication and some Advil and came up here for further evaluation.  No chest pain.  Does endorse some shortness of breath and chest congestion.  No abdominal pain.  No diarrhea or urinary symptoms no rashes.  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  The history is provided by the patient.  Influenza Presenting symptoms: fatigue, headache, myalgias, nausea, shortness of breath and vomiting   Presenting symptoms: no cough, no diarrhea and no fever   Myalgias:    Location:  Generalized   Quality:  Aching   Severity:  Severe   Onset quality:  Gradual   Duration:  2 days   Timing:  Constant   Progression:  Unchanged Risk factors: no diabetes problem, no heart disease, no immunocompromised state, no kidney disease and no liver disease        Home Medications Prior to Admission medications   Medication Sig Start Date End Date Taking? Authorizing Provider  amLODipine (NORVASC) 5 MG tablet Take 1 tablet (5 mg total) by mouth daily. 10/27/20  Yes Dorie Rank, MD  ibuprofen (ADVIL,MOTRIN) 800 MG tablet Take 1 tablet (800 mg total) by mouth 3 (three) times daily. Patient not taking: Reported on 10/27/2020 12/17/13   Hazel Sams, PA-C  ondansetron (ZOFRAN) 4 MG tablet Take 1 tablet (4 mg total) by mouth every 6 (six) hours. 01/18/22   Tonye Pearson, PA-C  pantoprazole (PROTONIX) 20 MG tablet Take 1  tablet (20 mg total) by mouth daily. 10/27/20   Dorie Rank, MD  sucralfate (CARAFATE) 1 g tablet Take 1 tablet (1 g total) by mouth 4 (four) times daily -  with meals and at bedtime. 10/27/20   Dorie Rank, MD      Allergies    Patient has no known allergies.    Review of Systems   Review of Systems  Constitutional:  Positive for fatigue. Negative for fever.  Eyes:  Positive for visual disturbance.  Respiratory:  Positive for shortness of breath. Negative for cough.   Cardiovascular:  Negative for chest pain.  Gastrointestinal:  Positive for nausea and vomiting. Negative for diarrhea.  Genitourinary:  Negative for dysuria.  Musculoskeletal:  Positive for myalgias.  Neurological:  Positive for headaches.    Physical Exam Updated Vital Signs BP (!) 153/93 (BP Location: Right Arm)   Pulse 81   Temp 98.8 F (37.1 C) (Oral)   Resp 20   Ht 6' (1.829 m)   Wt 127 kg   SpO2 99%   BMI 37.97 kg/m  Physical Exam Vitals and nursing note reviewed.  Constitutional:      General: He is not in acute distress.    Appearance: Normal appearance. He is well-developed.  HENT:     Head: Normocephalic and atraumatic.  Eyes:     Conjunctiva/sclera: Conjunctivae normal.  Cardiovascular:     Rate and Rhythm:  Normal rate and regular rhythm.     Heart sounds: No murmur heard. Pulmonary:     Effort: Pulmonary effort is normal. No respiratory distress.     Breath sounds: Normal breath sounds.  Abdominal:     Palpations: Abdomen is soft.     Tenderness: There is no abdominal tenderness.  Musculoskeletal:        General: No deformity.     Cervical back: Neck supple.  Skin:    General: Skin is warm and dry.     Capillary Refill: Capillary refill takes less than 2 seconds.  Neurological:     General: No focal deficit present.     Mental Status: He is alert.     Sensory: No sensory deficit.     Motor: No weakness.     ED Results / Procedures / Treatments   Labs (all labs ordered are listed,  but only abnormal results are displayed) Labs Reviewed  COMPREHENSIVE METABOLIC PANEL - Abnormal; Notable for the following components:      Result Value   Glucose, Bld 101 (*)    All other components within normal limits  CBC WITH DIFFERENTIAL/PLATELET - Abnormal; Notable for the following components:   Lymphs Abs 0.2 (*)    All other components within normal limits  RESP PANEL BY RT-PCR (RSV, FLU A&B, COVID)  RVPGX2  LIPASE, BLOOD    EKG EKG Interpretation  Date/Time:  Friday August 23 2022 08:22:02 EST Ventricular Rate:  83 PR Interval:  144 QRS Duration: 94 QT Interval:  367 QTC Calculation: 432 R Axis:   -11 Text Interpretation: Sinus rhythm LVH by voltage No significant change since prior 6/23 Confirmed by Meridee Score [phone]) on 08/23/2022 8:29:13 AM  Radiology DG Chest Port 1 View  Result Date: 08/23/2022 CLINICAL DATA:  Weakness EXAM: PORTABLE CHEST 1 VIEW COMPARISON:  10/27/2020 FINDINGS: The cardiomediastinal silhouette is unremarkable. There is no evidence of focal airspace disease, pulmonary edema, suspicious pulmonary nodule/mass, pleural effusion, or pneumothorax. No acute bony abnormalities are identified. IMPRESSION: No active disease. Electronically Signed   By: Harmon Pier M.D.   On: 08/23/2022 08:58    Procedures Procedures    Medications Ordered in ED Medications  sodium chloride 0.9 % bolus 1,000 mL (has no administration in time range)  ondansetron (ZOFRAN) injection 4 mg (has no administration in time range)    ED Course/ Medical Decision Making/ A&P Clinical Course as of 08/23/22 1638  Fri Aug 23, 2022  0902 Chest x-ray interpreted by me as no acute infiltrates.  Awaiting radiology reading. [MB]  1011 Reviewed results of workup with patient.  He is comfortable plan for discharge.  Return instructions discussed [MB]    Clinical Course User Index [MB] Terrilee Files, MD                           Medical Decision Making Amount and/or  Complexity of Data Reviewed Labs: ordered. Radiology: ordered.  Risk OTC drugs. Prescription drug management.   This patient complains of headache body aches fatigue; this involves an extensive number of treatment Options and is a complaint that carries with it a high risk of complications and morbidity. The differential includes viral syndrome, COVID, flu, pneumonia, dehydration, metabolic derangement  I ordered, reviewed and interpreted labs, which included CBC with normal white count normal hemoglobin, chemistries and LFTs normal, COVID flu RSV negative I ordered medication IV fluids and nausea medication Tylenol and reviewed PMP when  indicated. I ordered imaging studies which included chest x-ray and I independently    visualized and interpreted imaging which showed no acute findings Previous records obtained and reviewed in epic no recent admissions  Cardiac monitoring reviewed, normal sinus rhythm Social determinants considered, ongoing tobacco use Critical Interventions: None  After the interventions stated above, I reevaluated the patient and found patient with stable vitals nontoxic-appearing Admission and further testing considered, no indications for admission or further workup at this time.  Recommended symptomatic treatment and follow-up PCP return instructions discussed         Final Clinical Impression(s) / ED Diagnoses Final diagnoses:  Acute viral syndrome  Primary hypertension    Rx / DC Orders ED Discharge Orders     None         Hayden Rasmussen, MD 08/23/22 1639

## 2024-07-14 ENCOUNTER — Emergency Department (HOSPITAL_BASED_OUTPATIENT_CLINIC_OR_DEPARTMENT_OTHER): Payer: Self-pay

## 2024-07-14 ENCOUNTER — Emergency Department (HOSPITAL_BASED_OUTPATIENT_CLINIC_OR_DEPARTMENT_OTHER)
Admission: EM | Admit: 2024-07-14 | Discharge: 2024-07-14 | Disposition: A | Discharge status: Home / Self Care | Payer: Self-pay | Attending: Emergency Medicine | Admitting: Emergency Medicine

## 2024-07-14 ENCOUNTER — Other Ambulatory Visit: Payer: Self-pay

## 2024-07-14 DIAGNOSIS — W268XXA Contact with other sharp object(s), not elsewhere classified, initial encounter: Secondary | ICD-10-CM | POA: Insufficient documentation

## 2024-07-14 DIAGNOSIS — Z23 Encounter for immunization: Secondary | ICD-10-CM | POA: Insufficient documentation

## 2024-07-14 DIAGNOSIS — Y990 Civilian activity done for income or pay: Secondary | ICD-10-CM | POA: Insufficient documentation

## 2024-07-14 DIAGNOSIS — S61211A Laceration without foreign body of left index finger without damage to nail, initial encounter: Secondary | ICD-10-CM | POA: Insufficient documentation

## 2024-07-14 MED ORDER — LIDOCAINE HCL (PF) 1 % IJ SOLN
5.0000 mL | Freq: Once | INTRAMUSCULAR | Status: AC
  Administered 2024-07-14: 5 mL
  Filled 2024-07-14: qty 5

## 2024-07-14 MED ORDER — LIDOCAINE-EPINEPHRINE (PF) 2 %-1:200000 IJ SOLN
20.0000 mL | Freq: Once | INTRAMUSCULAR | Status: AC
  Administered 2024-07-14: 20 mL

## 2024-07-14 MED ORDER — LIDOCAINE-EPINEPHRINE 2 %-1:100000 IJ SOLN: 30.0000 mL | Freq: Once | INTRAMUSCULAR | Status: DC

## 2024-07-14 MED ORDER — TETANUS-DIPHTH-ACELL PERTUSSIS 5-2-15.5 LF-MCG/0.5 IM SUSP
0.5000 mL | Freq: Once | INTRAMUSCULAR | Status: AC
  Administered 2024-07-14: 0.5 mL via INTRAMUSCULAR
  Filled 2024-07-14: qty 0.5

## 2024-07-14 NOTE — ED Triage Notes (Addendum)
 Pt POV with lac to L index finger after cutting with knife while cooking. Bleeding controlled PTA.  Hypertensive in triage, hx same, not taking BP meds as prescribed

## 2024-07-14 NOTE — Discharge Instructions (Addendum)
 You were seen in the emergency department today for concerns of a cut to your finger.  This cut required to be repaired with stitches.  The stitches should remain in place for the next 7 to 10 days or you should return to the emergency department or seek evaluation between 12/10 - 12/13 for a wound evaluation and for likely removal of the stitches.  If at any time you notice any signs of infection such as redness, swelling, or drainage that is nonbloody such as pus, seek evaluation as there may be concerns for infection in this wound.

## 2024-07-14 NOTE — ED Notes (Signed)
Patient verbalizes understanding of discharge instructions. Opportunity for questioning and answers were provided. Armband removed by staff, pt discharged from ED. Ambulated out to lobby  

## 2024-07-14 NOTE — ED Provider Notes (Signed)
 [facility] EMERGENC[facility] DEPARTMENT AT [facility] Provider Note   CSN: [id number] Arrival date & time: 07/14/24  1933     Patient presents with: Laceration   Gary Strickland is a 38 y.o. male.  Patient without significant medical history presents to the emergency department with concerns of finger laceration.  States that he was at work when he cut through his glove while cooking and sustained a laceration to the left index finger.  He is unsure of his last Tdap.  Denies any feelings of tingling or numbness.  Bleeding reportedly controlled prior to arriving.   Laceration      Prior to Admission medications   Medication Sig Start Date End Date Taking? Authorizing Provider  amLODipine  (NORVASC ) 5 MG tablet Take 1 tablet (5 mg total) by mouth daily. 10/27/20   Randol Simmonds, MD  ibuprofen  (ADVIL ,MOTRIN ) 800 MG tablet Take 1 tablet (800 mg total) by mouth 3 (three) times daily. Patient not taking: Reported on 10/27/2020 12/17/13   Dammen, Peter, PA-C  ondansetron  (ZOFRAN ) 4 MG tablet Take 1 tablet (4 mg total) by mouth every 6 (six) hours. 08/23/22   Towana Ozell BROCKS, MD  pantoprazole  (PROTONIX ) 20 MG tablet Take 1 tablet (20 mg total) by mouth daily. 10/27/20   Randol Simmonds, MD  sucralfate  (CARAFATE ) 1 g tablet Take 1 tablet (1 g total) by mouth 4 (four) times daily -  with meals and at bedtime. 10/27/20   Randol Simmonds, MD    Allergies: Patient has no known allergies.    Review of Systems  Skin:  Positive for wound.  All other systems reviewed and are negative.   Updated Vital Signs BP (!) 186/112   Pulse 100   Temp 97.8 F (36.6 C) (Temporal)   Resp 19   Ht 6' (1.829 m)   Wt 104.3 kg   SpO2 100%   BMI 31.19 kg/m   Physical Exam Vitals and nursing note reviewed.  Constitutional:      General: He is not in acute distress.    Appearance: He is well-developed.  HENT:     Head: Normocephalic and atraumatic.  Eyes:     Conjunctiva/sclera: Conjunctivae normal.   Cardiovascular:     Heart sounds: No murmur heard. Pulmonary:     Effort: Pulmonary effort is normal. No respiratory distress.     Breath sounds: Normal breath sounds.  Abdominal:     Palpations: Abdomen is soft.     Tenderness: There is no abdominal tenderness.  Musculoskeletal:        General: No swelling.  Skin:    General: Skin is warm and dry.     Capillary Refill: Capillary refill takes less than 2 seconds.     Comments: 3cm lesion to the pad of the left index finger.  Neurological:     Mental Status: He is alert.  Psychiatric:        Mood and Affect: Mood normal.     (all labs ordered are listed, but only abnormal results are displayed) Labs Reviewed - No data to display  EKG: None  Radiology: DG Finger Index Left Result Date: 07/14/2024 EXAM: 3 VIEW(S) XRAY OF THE FINGER 07/14/2024 07:55:37 PM COMPARISON: None available. CLINICAL HISTORY: laceration FINDINGS: BONES AND JOINTS: No acute fracture. No malalignment. SOFT TISSUES: Overlying dressing in place. Soft tissue edema. Laceration about the palmar surface of the distal index finger. IMPRESSION: 1. Laceration about the palmar surface of the distal index finger. No acute fracture. Electronically signed by:  Norman Gatlin MD 07/14/2024 08:51 PM EST RP Workstation: [id number]     .Laceration Repair  Date/Time: 07/14/2024 9:40 PM  Performed by: Davielle Lingelbach A, PA-C Authorized by: Kimberleigh Mehan A, PA-C   Consent:    Consent obtained:  Verbal   Consent given by:  Patient   Risks discussed:  Infection, poor cosmetic result and pain   Alternatives discussed:  No treatment Universal protocol:    Patient identity confirmed:  Verbally with patient, arm band and provided demographic data Anesthesia:    Anesthesia method:  Nerve block   Block location:  Left index finger   Block needle gauge:  27 G   Block technique:  Transthecal   Block injection procedure:  Anatomic landmarks identified, introduced needle,  incremental injection, anatomic landmarks palpated and negative aspiration for blood   Block outcome:  Anesthesia achieved Laceration details:    Location:  Finger   Finger location:  L index finger   Length (cm):  3   Depth (mm):  2 Pre-procedure details:    Preparation:  Imaging obtained to evaluate for foreign bodies Exploration:    Hemostasis achieved with:  Epinephrine   Imaging obtained: x-ray     Imaging outcome: foreign body not noted     Contaminated: no   Treatment:    Area cleansed with:  Chlorhexidine and saline   Amount of cleaning:  Standard   Irrigation solution:  Sterile saline   Irrigation volume:  100cc   Irrigation method:  Syringe Skin repair:    Repair method:  Sutures   Suture size:  5-0   Suture material:  Prolene   Suture technique:  Simple interrupted   Number of sutures:  4 Approximation:    Approximation:  Close Repair type:    Repair type:  Simple Post-procedure details:    Dressing:  Bulky dressing   Procedure completion:  Tolerated    Medications Ordered in the ED  Tdap (ADACEL) injection 0.5 mL (has no administration in time range)  lidocaine-EPINEPHrine (XYLOCAINE W/EPI) 2 %-1:200000 (PF) injection 20 mL (20 mLs Other Given 07/14/24 2035)  lidocaine (PF) (XYLOCAINE) 1 % injection 5 mL (5 mLs Other Given by Other 07/14/24 2125)                                    Medical Decision Making Amount and/or Complexity of Data Reviewed Radiology: ordered.  Risk Prescription drug management.   This patient presents to the ED for concern of finger laceration.  Differential diagnosis includes nail avulsion, injury of flexor tendon, skin laceration    Additional history obtained:  Additional history obtained from chart review   Imaging Studies ordered:  I ordered imaging studies including x-ray of the left index finger I independently visualized and interpreted imaging which showed no signs of bony injury, superficial laceration along  the pad of the left index finger I agree with the radiologist interpretation   Medicines ordered and prescription drug management:  I ordered medication including lidocaine with epinephrine, lidocaine, Tdap for hemostasis, nerve block, wound prophylaxis Reevaluation of the patient after these medicines showed that the patient improved I have reviewed the patients home medicines and have made adjustments as needed   Problem List / ED Course:  Patient presented to emergency department concerns of finger laceration.  Ports that he sustained a laceration to the left index finger after using a knife at work.  States that  he cut through his glove and was unable to get the bleeding to fully stop.  He wrapped the wound and came here for evaluation.  Unclear last Tdap. Wound examination reveals a 3 cm linear laceration to the pad of the left index finger.  There is no bleeding at this time.  Will soak wound and lidocaine with epinephrine to achieve hemostasis. We achieved hemostasis.  X-ray of the left index finger is unremarkable for bony injury but there is a notable soft tissue injury at the area noted on my exam.  Patient verbally consented to wound pair. Repair performed and patient tolerated without significant difficulty.  Tdap updated.  Advised patient return to the emergency department or urgent care or his primary care provider for suture removal in the next 7 to 10 days.  He is otherwise stable for outpatient follow-up and discharged home.   Social Determinants of Health:  None  Final diagnoses:  Laceration of left index finger without foreign body without damage to nail, initial encounter    ED Discharge Orders     None          Cecily Legrand DELENA DEVONNA 07/14/24 2143    Towana Ozell BROCKS, MD 07/15/24 1020

## 2024-07-20 ENCOUNTER — Encounter (HOSPITAL_COMMUNITY): Payer: Self-pay

## 2024-07-20 ENCOUNTER — Ambulatory Visit (HOSPITAL_COMMUNITY)
Admission: EM | Admit: 2024-07-20 | Discharge: 2024-07-20 | Disposition: A | Discharge status: Home / Self Care | Payer: Self-pay | Attending: Family Medicine | Admitting: Family Medicine

## 2024-07-20 DIAGNOSIS — Z4802 Encounter for removal of sutures: Secondary | ICD-10-CM

## 2024-07-20 DIAGNOSIS — B9689 Other specified bacterial agents as the cause of diseases classified elsewhere: Secondary | ICD-10-CM

## 2024-07-20 HISTORY — DX: Essential (primary) hypertension: I10

## 2024-07-20 MED ORDER — DOXYCYCLINE HYCLATE 100 MG PO CAPS: 100.0000 mg | ORAL_CAPSULE | Freq: Two times a day (BID) | ORAL | 0 refills | Status: AC

## 2024-07-20 NOTE — ED Triage Notes (Signed)
 Patient reports that he was seen on 07/14/24 for a laceration to the left pointer finger following a knife cut. Patient received stitches.  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 Patient has swelling, redness, and bruising present. No drainage present.

## 2024-07-20 NOTE — ED Provider Notes (Signed)
  [facility]   [id number] 07/20/24 Arrival Time: 9062  ASSESSMENT & PLAN:  1. Visit for suture removal   2. Localized bacterial skin infection    Sutures removed from L index finger by RN. Will treat for infection. No abscess requiring I&D.  Meds ordered this encounter  Medications   doxycycline  (VIBRAMYCIN ) 100 MG capsule    Sig: Take 1 capsule (100 mg total) by mouth 2 (two) times daily.    Dispense:  14 capsule    Refill:  0   Noted elevated BP discussed.  SUBJECTIVE:  Gary Strickland is a 38 y.o. male who presents for suture removal; L 2nd finger; placed in ED. Ques if area/skin is infected; has noted increased erythema over past couple of days. Denies drainage/bleeding.  OBJECTIVE:  Vitals:   07/20/24 0949  BP: (!) 171/112  Pulse: 82  Resp: 16  Temp: 98.4 F (36.9 C)  TempSrc: Oral  SpO2: 95%    General appearance: alert; no distress Skin: intact healed laceration to volar distal 2nd LEFT finger; skin to sides and near nailfold is red and warm Psychological: alert and cooperative; normal mood and affect    Labs Reviewed - No data to display  No results found.  No Known Allergies  Past Medical History:  Diagnosis Date   Hypertension    Social History   Socioeconomic History   Marital status: Significant Other    Spouse name: Not on file   Number of children: Not on file   Years of education: Not on file   Highest education level: Not on file  Occupational History   Not on file  Tobacco Use   Smoking status: Every Day    Types: Cigarettes   Smokeless tobacco: Not on file  Vaping Use   Vaping status: Never Used  Substance and Sexual Activity   Alcohol use: Yes   Drug use: Yes    Types: Marijuana   Sexual activity: Not on file  Other Topics Concern   Not on file  Social History Narrative   Not on file   Social Drivers of Health   Financial Resource Strain: Not on file  Food Insecurity: Not on file  Transportation Needs:  Not on file  Physical Activity: Not on file  Stress: Not on file  Social Connections: Not on file          Rolinda Rogue, MD 07/20/24 1020
